# Patient Record
Sex: Female | Born: 1985 | Race: White | Hispanic: No | Smoking: Never smoker
Health system: Southern US, Community
[De-identification: ages and names within clinical notes are randomized; demographics above are authoritative.]

## PROBLEM LIST (undated history)

## (undated) DIAGNOSIS — E559 Vitamin D deficiency, unspecified: Secondary | ICD-10-CM

---

## 2008-04-07 ENCOUNTER — Emergency Department (HOSPITAL_COMMUNITY): Admission: EM | Admit: 2008-04-07 | Discharge: 2008-04-07 | Payer: Self-pay | Admitting: Emergency Medicine

## 2008-05-18 ENCOUNTER — Other Ambulatory Visit: Admission: RE | Admit: 2008-05-18 | Discharge: 2008-05-18 | Payer: Self-pay | Admitting: Obstetrics and Gynecology

## 2008-07-28 ENCOUNTER — Ambulatory Visit (HOSPITAL_COMMUNITY): Admission: RE | Admit: 2008-07-28 | Discharge: 2008-07-28 | Payer: Self-pay | Admitting: Obstetrics & Gynecology

## 2008-08-21 ENCOUNTER — Inpatient Hospital Stay (HOSPITAL_COMMUNITY): Admission: RE | Admit: 2008-08-21 | Discharge: 2008-08-24 | Payer: Self-pay | Admitting: Obstetrics and Gynecology

## 2008-08-21 ENCOUNTER — Ambulatory Visit: Payer: Self-pay | Admitting: Obstetrics and Gynecology

## 2008-08-22 ENCOUNTER — Encounter (INDEPENDENT_AMBULATORY_CARE_PROVIDER_SITE_OTHER): Payer: Self-pay | Admitting: Obstetrics and Gynecology

## 2010-05-15 LAB — CBC
Hemoglobin: 12.5 g/dL (ref 12.0–15.0)
Hemoglobin: 9.4 g/dL — ABNORMAL LOW (ref 12.0–15.0)
MCHC: 33.7 g/dL (ref 30.0–36.0)
MCHC: 33.8 g/dL (ref 30.0–36.0)
MCV: 83.3 fL (ref 78.0–100.0)
Platelets: 211 10*3/uL (ref 150–400)
RBC: 3.36 MIL/uL — ABNORMAL LOW (ref 3.87–5.11)
RDW: 15.6 % — ABNORMAL HIGH (ref 11.5–15.5)
RDW: 15.6 % — ABNORMAL HIGH (ref 11.5–15.5)

## 2010-05-15 LAB — CREATININE, SERUM: GFR calc Af Amer: >60 mL/min (ref >60)

## 2010-05-19 LAB — URINE CULTURE: Colony Count: >=100000

## 2010-05-19 LAB — PREGNANCY, URINE: Preg Test, Ur: POSITIVE

## 2010-05-19 LAB — URINE MICROSCOPIC-ADD ON

## 2010-05-19 LAB — URINALYSIS, ROUTINE W REFLEX MICROSCOPIC
Bilirubin Urine: NEGATIVE
Glucose, UA: NEGATIVE mg/dL
Ketones, ur: NEGATIVE mg/dL
pH: 6 (ref 5.0–8.0)

## 2010-06-24 NOTE — Discharge Summary (Signed)
Laurie Osborne, Laurie Osborne             ACCOUNT NO.:  [id number]   MEDICAL RECORD NO.:  [id number]          PATIENT TYPE:  INP   LOCATION:  9133                          FACILITY:  [facility]   PHYSICIAN:  Tilda Burrow, M.D. DATE OF BIRTH:  Jan 02, 1986   DATE OF ADMISSION:  08/21/2008  DATE OF DISCHARGE:  08/24/2008                               DISCHARGE SUMMARY   PRIMARY CARE Shyam Dawson:  Dr. Christin Bach at [facility].   DISCHARGE DIAGNOSES:  1. Induction of labor secondary to post date.  2. Late second-stage decelerations.  3. Chorioamnionitis.  4. Vacuum-assisted vaginal delivery.   DISCHARGE MEDICATIONS:  1. Prenatal vitamins.  2. Ibuprofen.  3. Colace.  4. Iron.  5. Dermoplast spray.   CONSULTANT:  Dr. Christin Bach at [facility] was involved heavily in  the care and performed vacuum-assisted vaginal delivery.   PROCEDURES:  Vacuum-assisted vaginal delivery on August 21, 2008.   LABORATORY DATA:  Discharge hemoglobin was 9.6.  Prenatal labs were  essentially normal.   BRIEF HOSPITAL COURSE:  Laurie Osborne is a 25 year old female who  presented at G2, P 1-0-0-1, who was here for induction of labor  secondary to post date.  She did well with labor and her delivery was  significant for artificial rupture of membranes and internalization of  monitoring secondary to poor fetal heart tones with fetal recovery, with  reassuring-looking strip.  However, at the late second stage of labor,  the fetus had decels and Laurie Osborne developed a fever.  She was  diagnosed with chorioamnionitis and given ampicillin and gentamicin.  The mother pushed until she was +3 and then a vacuum-assisted vaginal  delivery was performed, and the baby was delivered without serious  complications.  Baby's Apgars were 8 and 9 at 1 and 5 minutes.  Baby boy  was born, vigorous.  Estimated blood loss was 500.  Postpartum care was  significant for continuation of antibiotics until 24 hours, as afebrile.  Her postpartum  care was essentially without complications.  She is discharged breast  feeding.  We would like the circumcision at [facility] and her  contraception plans are for Implanon versus Mirena at her 6-week check  at [facility].  She did have a second-degree perineal laceration, which  was repaired without incident.      Clementeen Graham, MD      Tilda Burrow, M.D.  Electronically Signed    EC/MEDQ  D:  10/02/2008  T:  10/02/2008  Job:  [id number]   cc:   Tilda Burrow, M.D.  Fax: [phone]

## 2013-06-24 ENCOUNTER — Encounter (HOSPITAL_COMMUNITY): Payer: Self-pay | Admitting: Emergency Medicine

## 2013-06-24 ENCOUNTER — Emergency Department (HOSPITAL_COMMUNITY)
Admission: EM | Admit: 2013-06-24 | Discharge: 2013-06-24 | Disposition: A | Discharge status: Home / Self Care | Payer: Non-veteran care | Attending: Emergency Medicine | Admitting: Emergency Medicine

## 2013-06-24 ENCOUNTER — Emergency Department (HOSPITAL_COMMUNITY): Payer: Non-veteran care

## 2013-06-24 DIAGNOSIS — Z862 Personal history of diseases of the blood and blood-forming organs and certain disorders involving the immune mechanism: Secondary | ICD-10-CM | POA: Insufficient documentation

## 2013-06-24 DIAGNOSIS — Z8639 Personal history of other endocrine, nutritional and metabolic disease: Secondary | ICD-10-CM | POA: Insufficient documentation

## 2013-06-24 DIAGNOSIS — R599 Enlarged lymph nodes, unspecified: Secondary | ICD-10-CM | POA: Insufficient documentation

## 2013-06-24 DIAGNOSIS — R590 Localized enlarged lymph nodes: Secondary | ICD-10-CM

## 2013-06-24 HISTORY — DX: Vitamin D deficiency, unspecified: E55.9

## 2013-06-24 LAB — COMPREHENSIVE METABOLIC PANEL
ALT: 20 U/L (ref 0–35)
AST: 15 U/L (ref 0–37)
Albumin: 3.7 g/dL (ref 3.5–5.2)
Alkaline Phosphatase: 78 U/L (ref 39–117)
BUN: 9 mg/dL (ref 6–23)
CHLORIDE: 100 meq/L (ref 96–112)
CO2: 28 mEq/L (ref 19–32)
Calcium: 9.6 mg/dL (ref 8.4–10.5)
Creatinine, Ser: 0.76 mg/dL (ref 0.50–1.10)
GLUCOSE: 105 mg/dL — AB (ref 70–99)
POTASSIUM: 4.1 meq/L (ref 3.7–5.3)
SODIUM: 139 meq/L (ref 137–147)
Total Bilirubin: 0.3 mg/dL (ref 0.3–1.2)
Total Protein: 7.8 g/dL (ref 6.0–8.3)

## 2013-06-24 LAB — I-STAT CHEM 8, ED
BUN: 7 mg/dL (ref 6–23)
CREATININE: 0.8 mg/dL (ref 0.50–1.10)
Calcium, Ion: 1.23 mmol/L (ref 1.12–1.23)
Chloride: 102 mEq/L (ref 96–112)
Glucose, Bld: 104 mg/dL — ABNORMAL HIGH (ref 70–99)
HCT: 43 % (ref 36.0–46.0)
HEMOGLOBIN: 14.6 g/dL (ref 12.0–15.0)
POTASSIUM: 4 meq/L (ref 3.7–5.3)
SODIUM: 139 meq/L (ref 137–147)
TCO2: 27 mmol/L (ref 0–100)

## 2013-06-24 LAB — CBC WITH DIFFERENTIAL/PLATELET
Basophils Absolute: 0 10*3/uL (ref 0.0–0.1)
Basophils Relative: 0 % (ref 0–1)
Eosinophils Absolute: 0.1 10*3/uL (ref 0.0–0.7)
Eosinophils Relative: 1 % (ref 0–5)
HCT: 40.8 % (ref 36.0–46.0)
HEMOGLOBIN: 13.7 g/dL (ref 12.0–15.0)
LYMPHS ABS: 2.2 10*3/uL (ref 0.7–4.0)
LYMPHS PCT: 16 % (ref 12–46)
MCH: 28.7 pg (ref 26.0–34.0)
MCHC: 33.6 g/dL (ref 30.0–36.0)
MCV: 85.4 fL (ref 78.0–100.0)
MONOS PCT: 7 % (ref 3–12)
Monocytes Absolute: 0.9 10*3/uL (ref 0.1–1.0)
NEUTROS ABS: 10.4 10*3/uL — AB (ref 1.7–7.7)
NEUTROS PCT: 76 % (ref 43–77)
PLATELETS: 299 10*3/uL (ref 150–400)
RBC: 4.78 MIL/uL (ref 3.87–5.11)
RDW: 12.8 % (ref 11.5–15.5)
WBC: 13.7 10*3/uL — AB (ref 4.0–10.5)

## 2013-06-24 LAB — MONONUCLEOSIS SCREEN: MONO SCREEN: NEGATIVE

## 2013-06-24 MED ORDER — SODIUM CHLORIDE 0.9 % IV SOLN
INTRAVENOUS | Status: DC
  Administered 2013-06-24: 18:00:00 via INTRAVENOUS

## 2013-06-24 MED ORDER — CEPHALEXIN 500 MG PO CAPS: 500.0000 mg | ORAL_CAPSULE | Freq: Three times a day (TID) | ORAL | Status: AC

## 2013-06-24 MED ORDER — NAPROXEN 500 MG PO TABS: 500.0000 mg | ORAL_TABLET | Freq: Two times a day (BID) | ORAL | Status: AC | PRN

## 2013-06-24 MED ORDER — IOHEXOL 300 MG/ML  SOLN
75.0000 mL | Freq: Once | INTRAMUSCULAR | Status: AC | PRN
  Administered 2013-06-24: 75 mL via INTRAVENOUS

## 2013-06-24 NOTE — ED Notes (Signed)
Pt with recent sinus infection, penicillin and Claritin d finished late April/early may, Sat pt noted swelling to lymph nodes on left side of neck, + emesis

## 2013-06-24 NOTE — ED Notes (Signed)
Pt alert & oriented x4, stable gait. Patient given discharge instructions, paperwork & prescription(s). Patient  instructed to stop at the registration desk to finish any additional paperwork. Patient verbalized understanding. Pt left department w/ no further questions. 

## 2013-06-24 NOTE — Discharge Instructions (Signed)
Take the antibiotic until gone. You should have the swollen lymph nodes rechecked in about 10 days. You should be rechecked sooner if you get a fever, or they continue getting bigger or if you have difficulty swallowing or breathing. You may need a biopsy of the lymph nodes if they do not start to improve. This would be done by an ENT physician.    Lymphadenopathy Lymphadenopathy means "disease of the lymph glands." But the term is usually used to describe swollen or enlarged lymph glands, also called lymph nodes. These are the bean-shaped organs found in many locations including the neck, underarm, and groin. Lymph glands are part of the immune system, which fights infections in your body. Lymphadenopathy can occur in just one area of the body, such as the neck, or it can be generalized, with lymph node enlargement in several areas. The nodes found in the neck are the most common sites of lymphadenopathy. CAUSES  When your immune system responds to germs (such as viruses or bacteria ), infection-fighting cells and fluid build up. This causes the glands to grow in size. This is usually not something to worry about. Sometimes, the glands themselves can become infected and inflamed. This is called lymphadenitis. Enlarged lymph nodes can be caused by many diseases:  Bacterial disease, such as strep throat or a skin infection.  Viral disease, such as a common cold.  Other germs, such as lyme disease, tuberculosis, or sexually transmitted diseases.  Cancers, such as lymphoma (cancer of the lymphatic system) or leukemia (cancer of the white blood cells).  Inflammatory diseases such as lupus or rheumatoid arthritis.  Reactions to medications. Many of the diseases above are rare, but important. This is why you should see your caregiver if you have lymphadenopathy. SYMPTOMS   Swollen, enlarged lumps in the neck, back of the head or other locations.  Tenderness.  Warmth or redness of the skin over  the lymph nodes.  Fever. DIAGNOSIS  Enlarged lymph nodes are often near the source of infection. They can help healthcare providers diagnose your illness. For instance:   Swollen lymph nodes around the jaw might be caused by an infection in the mouth.  Enlarged glands in the neck often signal a throat infection.  Lymph nodes that are swollen in more than one area often indicate an illness caused by a virus. Your caregiver most likely will know what is causing your lymphadenopathy after listening to your history and examining you. Blood tests, x-rays or other tests may be needed. If the cause of the enlarged lymph node cannot be found, and it does not go away by itself, then a biopsy may be needed. Your caregiver will discuss this with you. TREATMENT  Treatment for your enlarged lymph nodes will depend on the cause. Many times the nodes will shrink to normal size by themselves, with no treatment. Antibiotics or other medicines may be needed for infection. Only take over-the-counter or prescription medicines for pain, discomfort or fever as directed by your caregiver. HOME CARE INSTRUCTIONS  Swollen lymph glands usually return to normal when the underlying medical condition goes away. If they persist, contact your health-care provider. He/she might prescribe antibiotics or other treatments, depending on the diagnosis. Take any medications exactly as prescribed. Keep any follow-up appointments made to check on the condition of your enlarged nodes.  SEEK MEDICAL CARE IF:   Swelling lasts for more than two weeks.  You have symptoms such as weight loss, night sweats, fatigue or fever that does  not go away.  The lymph nodes are hard, seem fixed to the skin or are growing rapidly.  Skin over the lymph nodes is red and inflamed. This could mean there is an infection. SEEK IMMEDIATE MEDICAL CARE IF:   Fluid starts leaking from the area of the enlarged lymph node.  You develop a fever of 102 F  (38.9 C) or greater.  Severe pain develops (not necessarily at the site of a large lymph node).  You develop chest pain or shortness of breath.  You develop worsening abdominal pain. MAKE SURE YOU:   Understand these instructions.  Will watch your condition.  Will get help right away if you are not doing well or get worse. Document Released: 11/02/2007 Document Revised: 04/17/2011 Document Reviewed: 11/02/2007 [facility] Patient Information 2014 [place], [place].  Cervical Adenitis You have a swollen lymph gland in your neck. This commonly happens with Strep and virus infections, dental problems, insect bites, and injuries about the face, scalp, or neck. The lymph glands swell as the body fights the infection or heals the injury. Swelling and firmness typically lasts for several weeks after the infection or injury is healed. Rarely lymph glands can become swollen because of cancer or TB. Antibiotics are prescribed if there is evidence of an infection. Sometimes an infected lymph gland becomes filled with pus. This condition may require opening up the abscessed gland by draining it surgically. Most of the time infected glands return to normal within two weeks. Do not poke or squeeze the swollen lymph nodes. That may keep them from shrinking back to their normal size. If the lymph gland is still swollen after 2 weeks, further medical evaluation is needed.  SEEK IMMEDIATE MEDICAL CARE IF:  You have difficulty swallowing or breathing, increased swelling, severe pain, or a high fever.  Document Released: 01/23/2005 Document Revised: 04/17/2011 Document Reviewed: 07/15/2006 [facility] Patient Information 2014 [place], [place].

## 2013-06-24 NOTE — ED Provider Notes (Signed)
CSN: [id number]     Arrival date & time 06/24/13  1713 History   First MD Initiated Contact with Patient 06/24/13 1735     Chief Complaint  Patient presents with  . Lymphadenopathy     (Consider location/radiation/quality/duration/timing/severity/associated sxs/prior Treatment) HPI Patient reports she had a sinus infection at the end of April with sore throat, postnasal drip, nasal drainage that was clear, yellow, green, and rarely bloody. She denies any sneezing or fever. She relates her symptoms have improved with penicillin and taking mucinex DM. She started noticing a tender swelling in her left throat/neck 3 nights ago that is getting progressively worse. She denies any sore throat or pain on swallowing. She states it is tender to touch. She denies fever or noticing any other swollen areas. She denies earache, night sweats. She denies anybody else being ill that she's around. She states movement of her head makes the area hurt more. Nothing makes it feel better. She has not had this before.   [place] [facility] [place]  Past Medical History  Diagnosis Date  . Vitamin D deficiency    History reviewed. No pertinent past surgical history. History reviewed. No pertinent family history. History  Substance Use Topics  . Smoking status: Never Smoker   . Smokeless tobacco: Not on file  . Alcohol Use: No   Veteran, in the [employer] for [employer] yr at [employer], no overseas duty Starting a job with [employer] in [place] in June divorced  OB History   Grav Para Term Preterm Abortions TAB SAB Ect Mult Living                 Review of Systems  All other systems reviewed and are negative.     Allergies  Review of patient's allergies indicates no known allergies.  Home Medications   Prior to Admission medications   Not on File   BP 124/83  Pulse 93  Temp(Src) 98.5 F (36.9 C) (Oral)  Resp 18  Ht 4\' 11"  (1.499 m)  Wt 153 lb (69.4 kg)  BMI 30.89 kg/m2  SpO2 98%  LMP 06/04/2013  Vital signs  normal   Physical Exam  Nursing note and vitals reviewed. Constitutional: She is oriented to person, place, and time. She appears well-developed and well-nourished.  Non-toxic appearance. She does not appear ill. No distress.  HENT:  Head: Normocephalic and atraumatic.  Right Ear: External ear normal.  Left Ear: External ear normal.  Nose: Nose normal. No mucosal edema or rhinorrhea.  Mouth/Throat: Oropharynx is clear and moist and mucous membranes are normal. No dental abscesses or uvula swelling.  There appears to be some bulging medially of the anterior tonsillar fold on the left, there is no erythema, the tonsils are not enlarged, there is no purulent exudate seen. Patient's voice is normal.  Eyes: Conjunctivae and EOM are normal. Pupils are equal, round, and reactive to light.  Neck: Normal range of motion and full passive range of motion without pain. Neck supple. No thyromegaly present.  On gross exam patient has some fullness underneath her left mandible. On exam a tender round mass is felt just underneath the angle of the jaw but lies behind the proximal SCM muscle on the left. There is questionable to tiny lymph nodes in the supraclavicular space on the left.  Cardiovascular: Normal rate, regular rhythm and normal heart sounds.  Exam reveals no gallop and no friction rub.   No murmur heard. Pulmonary/Chest: Effort normal and breath sounds normal. No respiratory  distress. She has no wheezes. She has no rhonchi. She has no rales. She exhibits no tenderness and no crepitus.  Abdominal: Soft. Normal appearance and bowel sounds are normal. She exhibits no distension. There is no tenderness. There is no rebound and no guarding.  Musculoskeletal: Normal range of motion. She exhibits no edema and no tenderness.  Moves all extremities well.  There are no epi trochlear lymph nodes  Neurological: She is alert and oriented to person, place, and time. She has normal strength. No cranial nerve  deficit.  Skin: Skin is warm, dry and intact. No rash noted. No erythema. No pallor.  Psychiatric: She has a normal mood and affect. Her speech is normal and behavior is normal. Her mood appears not anxious.    ED Course  Procedures (including critical care time)  Medications  0.9 %  sodium chloride infusion ( Intravenous New Bag/Given 06/24/13 1814)  iohexol (OMNIPAQUE) 300 MG/ML solution 75 mL (75 mLs Intravenous Contrast Given 06/24/13 1856)    Patient refused pain medicine at her initial interview.  Pt given her CT results and need for follow up. She was given a CD of her CT scan to take with her to the [facility].     Labs Review Results for orders placed during the hospital encounter of 06/24/13  CBC WITH DIFFERENTIAL      Result Value Ref Range   WBC 13.7 (*) 4.0 - 10.5 K/uL   RBC 4.78  3.87 - 5.11 MIL/uL   Hemoglobin 13.7  12.0 - 15.0 g/dL   HCT 09.8  11.9 - 14.7 %   MCV 85.4  78.0 - 100.0 fL   MCH 28.7  26.0 - 34.0 pg   MCHC 33.6  30.0 - 36.0 g/dL   RDW 82.9  56.2 - 13.0 %   Platelets 299  150 - 400 K/uL   Neutrophils Relative % 76  43 - 77 %   Neutro Abs 10.4 (*) 1.7 - 7.7 K/uL   Lymphocytes Relative 16  12 - 46 %   Lymphs Abs 2.2  0.7 - 4.0 K/uL   Monocytes Relative 7  3 - 12 %   Monocytes Absolute 0.9  0.1 - 1.0 K/uL   Eosinophils Relative 1  0 - 5 %   Eosinophils Absolute 0.1  0.0 - 0.7 K/uL   Basophils Relative 0  0 - 1 %   Basophils Absolute 0.0  0.0 - 0.1 K/uL  MONONUCLEOSIS SCREEN      Result Value Ref Range   Mono Screen NEGATIVE  NEGATIVE  COMPREHENSIVE METABOLIC PANEL      Result Value Ref Range   Sodium 139  137 - 147 mEq/L   Potassium 4.1  3.7 - 5.3 mEq/L   Chloride 100  96 - 112 mEq/L   CO2 28  19 - 32 mEq/L   Glucose, Bld 105 (*) 70 - 99 mg/dL   BUN 9  6 - 23 mg/dL   Creatinine, Ser 8.65  0.50 - 1.10 mg/dL   Calcium 9.6  8.4 - 78.4 mg/dL   Total Protein 7.8  6.0 - 8.3 g/dL   Albumin 3.7  3.5 - 5.2 g/dL   AST 15  0 - 37 U/L   ALT 20  0 - 35 U/L    Alkaline Phosphatase 78  39 - 117 U/L   Total Bilirubin 0.3  0.3 - 1.2 mg/dL   GFR calc non Af Amer >90  >90 mL/min   GFR calc Af Amer >  90  >90 mL/min  I-STAT CHEM 8, ED      Result Value Ref Range   Sodium 139  137 - 147 mEq/L   Potassium 4.0  3.7 - 5.3 mEq/L   Chloride 102  96 - 112 mEq/L   BUN 7  6 - 23 mg/dL   Creatinine, Ser 1.610.80  0.50 - 1.10 mg/dL   Glucose, Bld 096104 (*) 70 - 99 mg/dL   Calcium, Ion 0.451.23  4.091.12 - 1.23 mmol/L   TCO2 27  0 - 100 mmol/L   Hemoglobin 14.6  12.0 - 15.0 g/dL   HCT 81.143.0  91.436.0 - 78.246.0 %   Laboratory interpretation all normal except mild leukocytosis     Imaging Review Ct Soft Tissue Neck W Contrast  06/24/2013   CLINICAL DATA:  swollen tender mass in left neck  EXAM: CT NECK WITH CONTRAST  TECHNIQUE: Multidetector CT imaging of the neck was performed using the standard protocol following the bolus administration of intravenous contrast.  CONTRAST:  75mL OMNIPAQUE IOHEXOL 300 MG/ML  SOLN  COMPARISON:  None.  FINDINGS: The skull base is unremarkable.  Bulky adenopathy is appreciated within the neck. The largest lymph node is in the lower lateral carotid space on the left measuring 15 mm in short axis. Largest lymph node on the right is in the lateral carotid space measuring 11 mm. Enlarged lymph nodes are also appreciated within the posterior cervical chains bilaterally. Otherwise No soft tissue masses, free fluid, or loculated fluid collection appreciated. There is a partially effacement of the retropharyngeal and parapharyngeal spaces of the neck.  The parotid and submandibular glands are symmetric without attenuation abnormalities. The lingual and palatine tonsils are prominent without evidence of focal masses or focal fluid collections. Coarse dystrophic calcifications are appreciated centrally within the lingual tonsils.  Airways patent.  The lung apices are unremarkable.  No acute osseous abnormalities.  IMPRESSION: Bulky adenopathy within the neck.  Differential considerations include an infectious or inflammatory etiologies. Particularly considering the prominence of the lingual and palatine tonsils. No ominous etiologies cannot be excluded and clinical correlation is recommended.   Electronically Signed   By: Salome HolmesHector  Cooper M.D.   On: 06/24/2013 19:45     EKG Interpretation None      MDM   Final diagnoses:  Lymphadenopathy, cervical   New Prescriptions   CEPHALEXIN (KEFLEX) 500 MG CAPSULE    Take 1 capsule (500 mg total) by mouth 3 (three) times daily.   NAPROXEN (NAPROSYN) 500 MG TABLET    Take 1 tablet (500 mg total) by mouth 2 (two) times daily as needed for moderate pain (take with food).    Plan discharge  Devoria AlbeIva Shilee Biggs, MD, [facility] DellFACEP      Jaquay Morneault L Hamish Banks, MD 06/24/13 2116

## 2015-11-23 IMAGING — CT CT NECK W/ CM
3 of 4 series · 13 of 33 positions shown, 16 images · IV contrast (omnipaque)
Comparison: None.

CLINICAL DATA: swollen tender mass in left neck

EXAM:
CT NECK WITH CONTRAST
TECHNIQUE: Multidetector CT imaging of the neck was performed using the
standard protocol following the bolus administration of intravenous
contrast.
CONTRAST:  75mL OMNIPAQUE IOHEXOL 300 MG/ML  SOLN

[Series 2: soft tissue neck 2.0 b31s · axial · 0.32mm/px · z∈[+56,+192]mm · 5 of 103 slices shown, 7 images]
[im 18/103  soft-tissue]
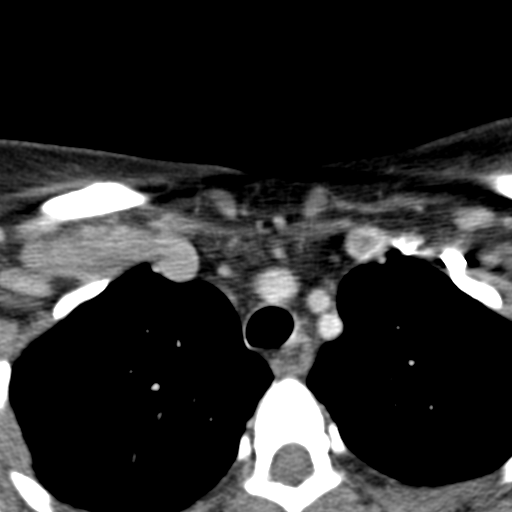
[im 18/103  bone]
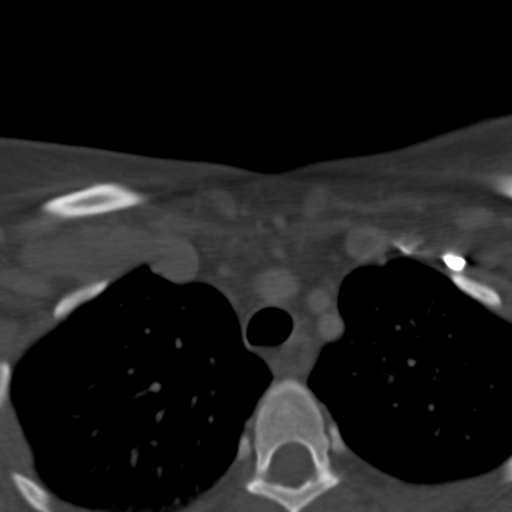
[im 35/103  bone]
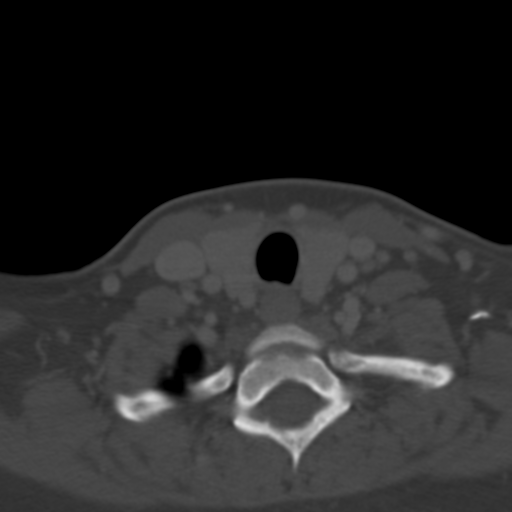
[im 52/103  bone]
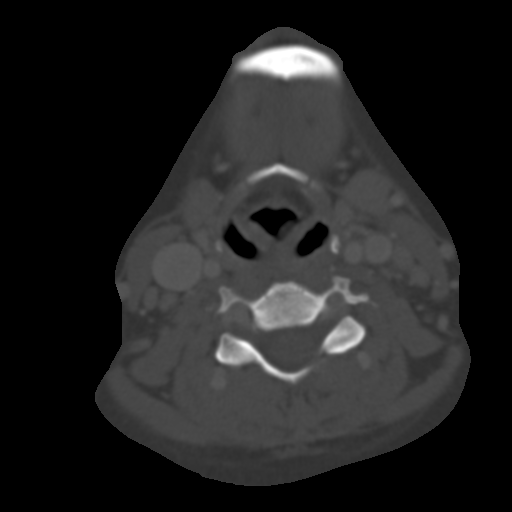
[im 69/103  bone]
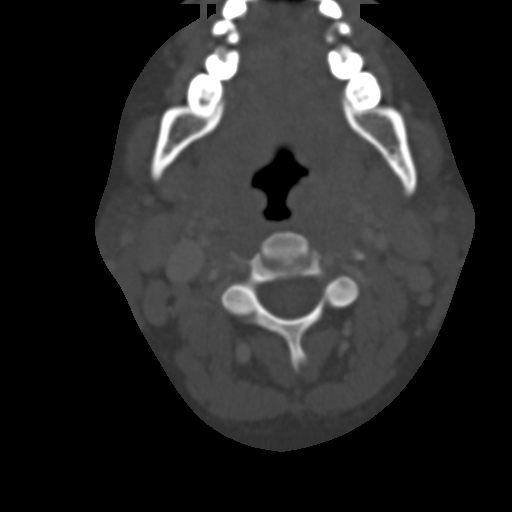
[im 86/103  soft-tissue]
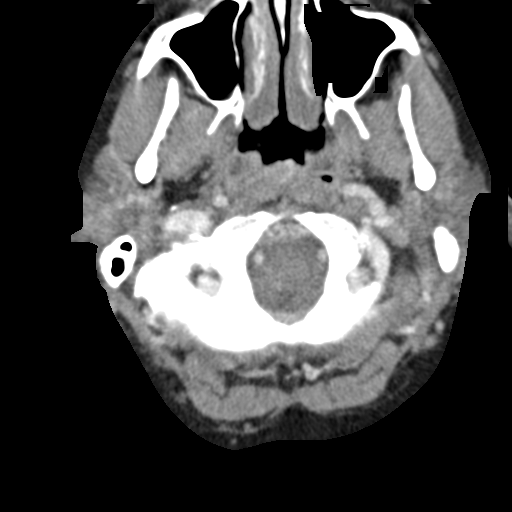
[im 86/103  bone]
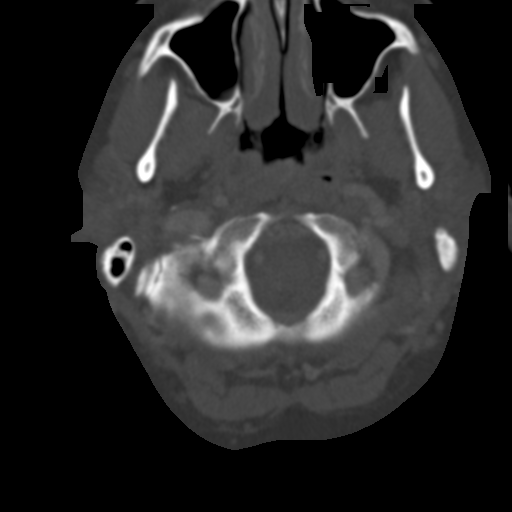

[Series 4: neck 2.0 soft tissue sag · sagittal · 0.29mm/px · 5 of 75 slices shown, 6 images]
[im 25/75  bone]
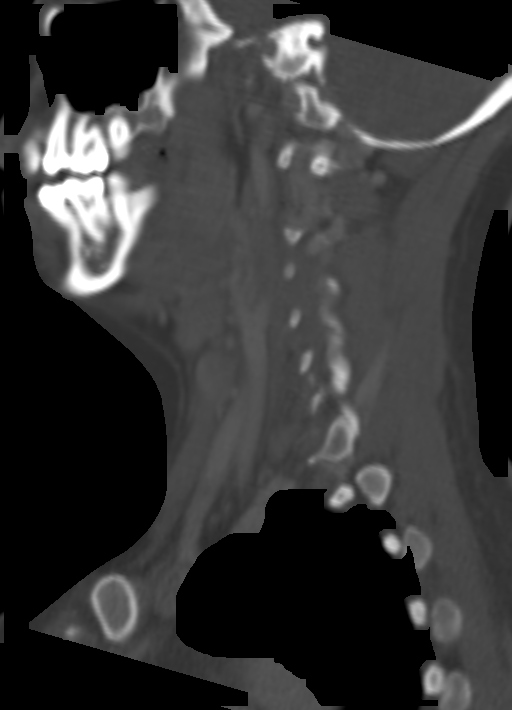
[im 31/75  bone]
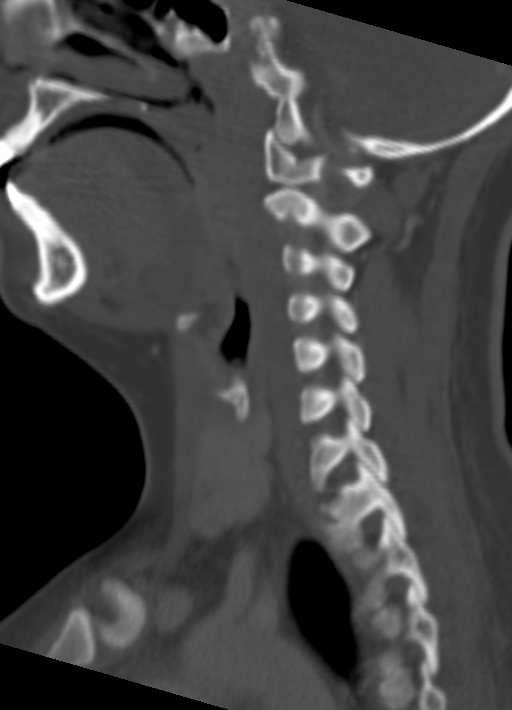
[im 38/75  soft-tissue]
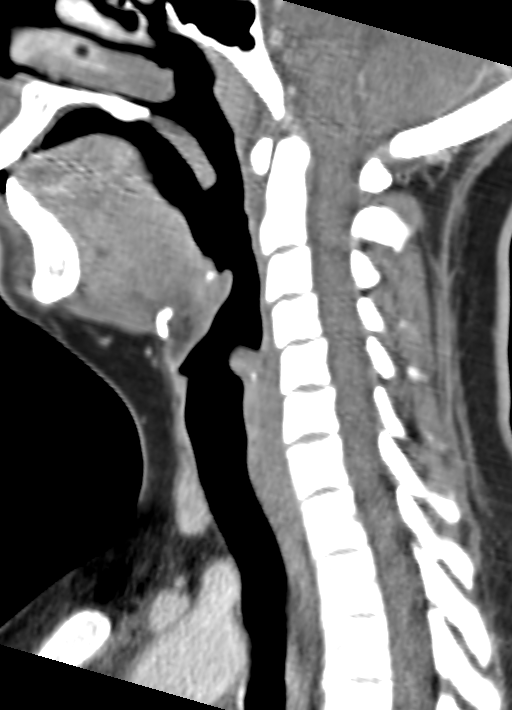
[im 38/75  bone]
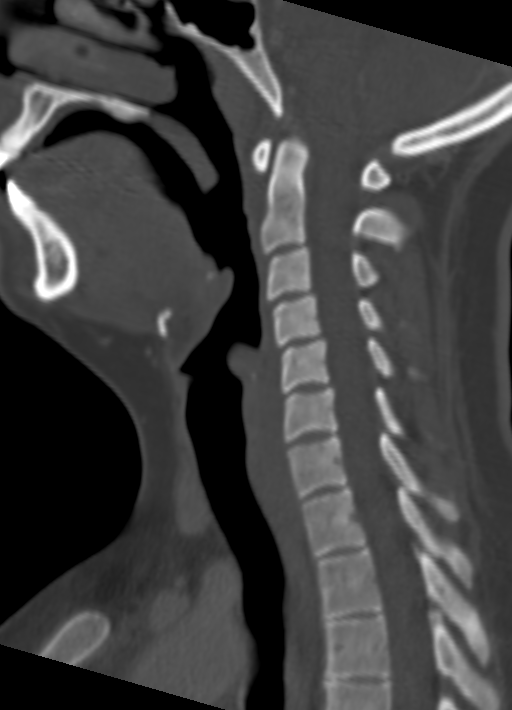
[im 44/75  bone]
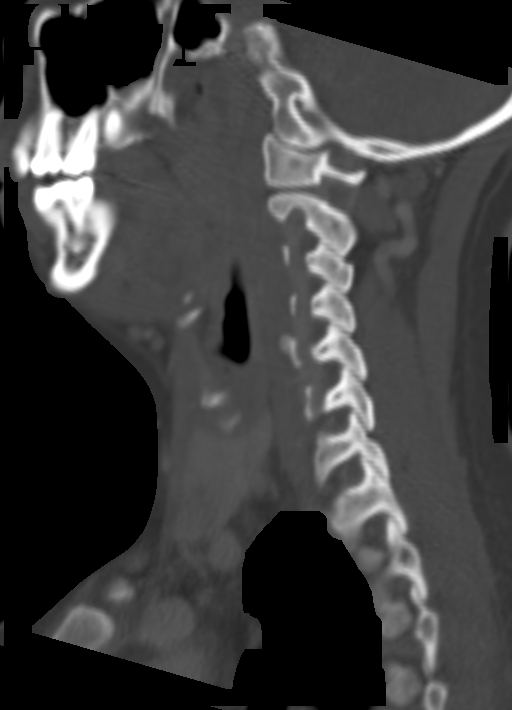
[im 50/75  bone]
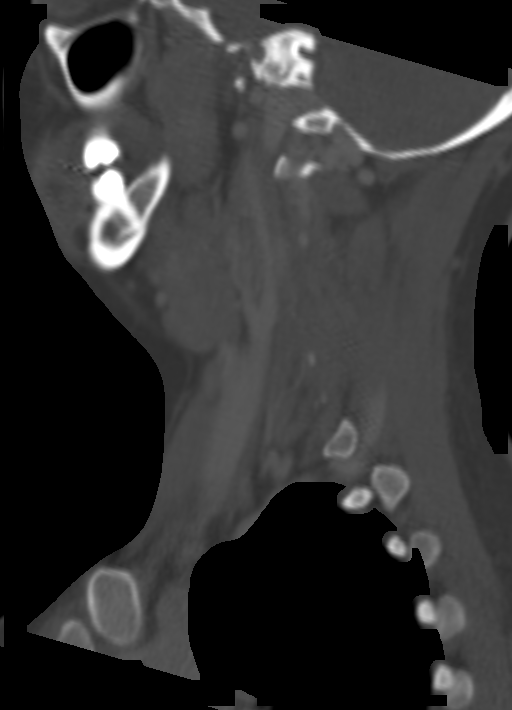

[Series 5: neck 2.0 soft tissue coro · coronal · 0.31mm/px · 3 of 74 slices shown]
[im 15/74  bone]
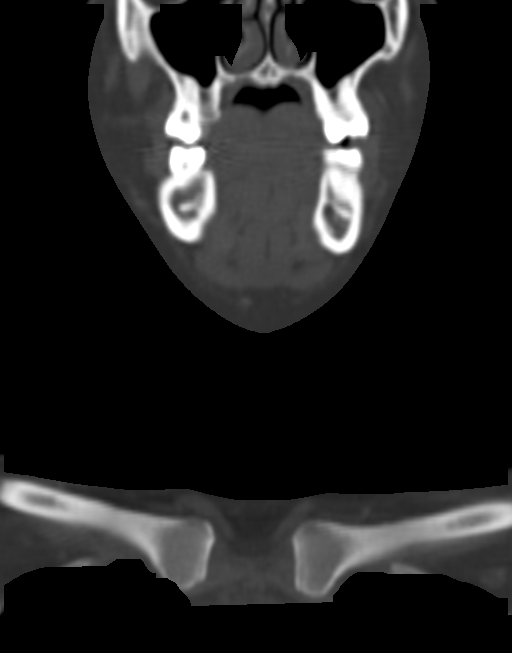
[im 30/74  bone]
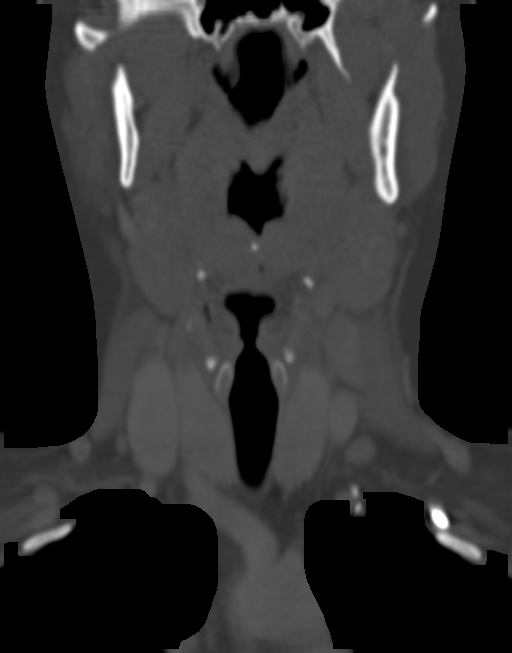
[im 44/74  bone]
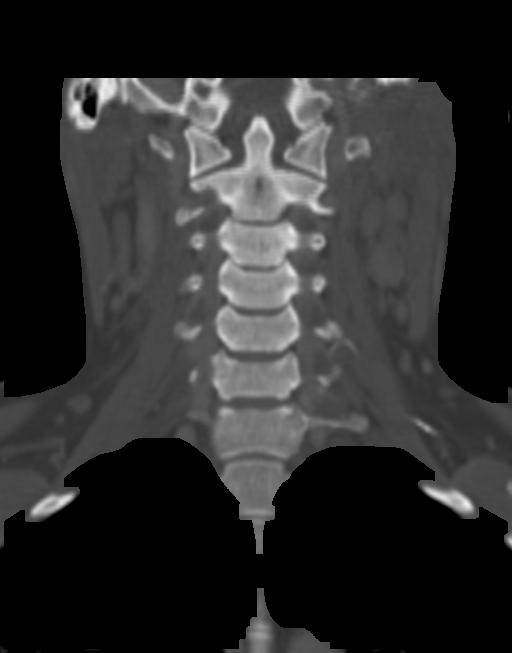

[13 of 33 positions shown; findings below may reference images not displayed]

FINDINGS: The skull base is unremarkable.

Bulky adenopathy is appreciated within the neck. The largest lymph
node is in the lower lateral carotid space on the left measuring 15
mm in short axis. Largest lymph node on the right is in the lateral
carotid space measuring 11 mm. Enlarged lymph nodes are also
appreciated within the posterior cervical chains bilaterally.
Otherwise No soft tissue masses, free fluid, or loculated fluid
collection appreciated. There is a partially effacement of the
retropharyngeal and parapharyngeal spaces of the neck.

The parotid and submandibular glands are symmetric without
attenuation abnormalities. The lingual and palatine tonsils are
prominent without evidence of focal masses or focal fluid
collections. Coarse dystrophic calcifications are appreciated
centrally within the lingual tonsils.

Airways patent.

The lung apices are unremarkable.

No acute osseous abnormalities.
IMPRESSION: Bulky adenopathy within the neck. Differential considerations
include an infectious or inflammatory etiologies. Particularly
considering the prominence of the lingual and palatine tonsils. No
ominous etiologies cannot be excluded and clinical correlation is
recommended.
